# Patient Record
Sex: Female | Born: 1965 | Race: White | Hispanic: No | Marital: Married | Smoking: Never smoker
Health system: Southern US, Community
[De-identification: ages and names within clinical notes are randomized; demographics above are authoritative.]

---

## 1998-07-08 ENCOUNTER — Inpatient Hospital Stay (HOSPITAL_COMMUNITY): Admission: AD | Admit: 1998-07-08 | Discharge: 1998-07-08 | Payer: Self-pay | Admitting: Obstetrics and Gynecology

## 1998-07-14 ENCOUNTER — Inpatient Hospital Stay (HOSPITAL_COMMUNITY): Admission: AD | Admit: 1998-07-14 | Discharge: 1998-07-14 | Payer: Self-pay | Admitting: Obstetrics and Gynecology

## 1999-10-17 ENCOUNTER — Other Ambulatory Visit: Admission: RE | Admit: 1999-10-17 | Discharge: 1999-10-17 | Payer: Self-pay | Admitting: Obstetrics & Gynecology

## 2000-11-26 ENCOUNTER — Other Ambulatory Visit: Admission: RE | Admit: 2000-11-26 | Discharge: 2000-11-26 | Payer: Self-pay | Admitting: Obstetrics & Gynecology

## 2001-12-29 ENCOUNTER — Other Ambulatory Visit: Admission: RE | Admit: 2001-12-29 | Discharge: 2001-12-29 | Payer: Self-pay | Admitting: Obstetrics and Gynecology

## 2002-01-01 ENCOUNTER — Encounter: Admission: RE | Admit: 2002-01-01 | Discharge: 2002-01-01 | Payer: Self-pay | Admitting: Obstetrics & Gynecology

## 2002-01-01 ENCOUNTER — Encounter: Payer: Self-pay | Admitting: Obstetrics & Gynecology

## 2003-03-03 ENCOUNTER — Other Ambulatory Visit: Admission: RE | Admit: 2003-03-03 | Discharge: 2003-03-03 | Payer: Self-pay | Admitting: Obstetrics and Gynecology

## 2004-03-20 ENCOUNTER — Other Ambulatory Visit: Admission: RE | Admit: 2004-03-20 | Discharge: 2004-03-20 | Payer: Self-pay | Admitting: Obstetrics and Gynecology

## 2004-08-22 ENCOUNTER — Ambulatory Visit: Payer: Self-pay | Admitting: Internal Medicine

## 2005-03-08 ENCOUNTER — Ambulatory Visit (HOSPITAL_COMMUNITY): Admission: RE | Admit: 2005-03-08 | Discharge: 2005-03-08 | Payer: Self-pay | Admitting: Obstetrics & Gynecology

## 2005-03-26 ENCOUNTER — Other Ambulatory Visit: Admission: RE | Admit: 2005-03-26 | Discharge: 2005-03-26 | Payer: Self-pay | Admitting: Obstetrics & Gynecology

## 2005-06-04 ENCOUNTER — Encounter: Admission: RE | Admit: 2005-06-04 | Discharge: 2005-06-04 | Payer: Self-pay | Admitting: Obstetrics & Gynecology

## 2005-07-04 ENCOUNTER — Ambulatory Visit: Payer: Self-pay | Admitting: Internal Medicine

## 2005-08-20 ENCOUNTER — Ambulatory Visit: Payer: Self-pay | Admitting: Internal Medicine

## 2005-08-23 ENCOUNTER — Ambulatory Visit: Payer: Self-pay | Admitting: Internal Medicine

## 2005-09-19 ENCOUNTER — Other Ambulatory Visit: Admission: RE | Admit: 2005-09-19 | Discharge: 2005-09-19 | Payer: Self-pay | Admitting: Obstetrics & Gynecology

## 2006-08-19 ENCOUNTER — Ambulatory Visit: Payer: Self-pay | Admitting: Internal Medicine

## 2006-08-19 LAB — CONVERTED CEMR LAB
AST: 17 units/L (ref 0–37)
Bilirubin, Direct: 0.1 mg/dL (ref 0.0–0.3)
Chloride: 110 meq/L (ref 96–112)
Cholesterol: 181 mg/dL (ref 0–200)
Creatinine, Ser: 0.7 mg/dL (ref 0.4–1.2)
Eosinophils Absolute: 0.1 10*3/uL (ref 0.0–0.6)
Eosinophils Relative: 0.9 % (ref 0.0–5.0)
GFR calc non Af Amer: 98 mL/min
Glucose, Bld: 99 mg/dL (ref 70–99)
HCT: 34 % — ABNORMAL LOW (ref 36.0–46.0)
Hemoglobin: 11.5 g/dL — ABNORMAL LOW (ref 12.0–15.0)
Ketones, ur: NEGATIVE mg/dL
LDL Cholesterol: 106 mg/dL — ABNORMAL HIGH (ref 0–99)
Leukocytes, UA: NEGATIVE
MCV: 90.2 fL (ref 78.0–100.0)
Neutrophils Relative %: 73.7 % (ref 43.0–77.0)
Nitrite: NEGATIVE
RBC: 3.77 M/uL — ABNORMAL LOW (ref 3.87–5.11)
Sodium: 142 meq/L (ref 135–145)
TSH: 1.51 microintl units/mL (ref 0.35–5.50)
Total Bilirubin: 0.9 mg/dL (ref 0.3–1.2)
Urobilinogen, UA: 0.2 (ref 0.0–1.0)
WBC: 6.1 10*3/uL (ref 4.5–10.5)

## 2006-08-26 ENCOUNTER — Ambulatory Visit: Payer: Self-pay | Admitting: Internal Medicine

## 2007-02-28 ENCOUNTER — Encounter: Admission: RE | Admit: 2007-02-28 | Discharge: 2007-02-28 | Payer: Self-pay | Admitting: Obstetrics & Gynecology

## 2007-04-25 ENCOUNTER — Telehealth: Payer: Self-pay | Admitting: Internal Medicine

## 2007-04-29 ENCOUNTER — Encounter: Payer: Self-pay | Admitting: Internal Medicine

## 2007-05-01 IMAGING — CR DG CHEST 1V PORT
1 series · 1 of 1 positions shown · non-contrast
Comparison: None.

CLINICAL DATA: Chest tightness. 
 PORTABLE CHEST - 1 VIEW:

[AP]
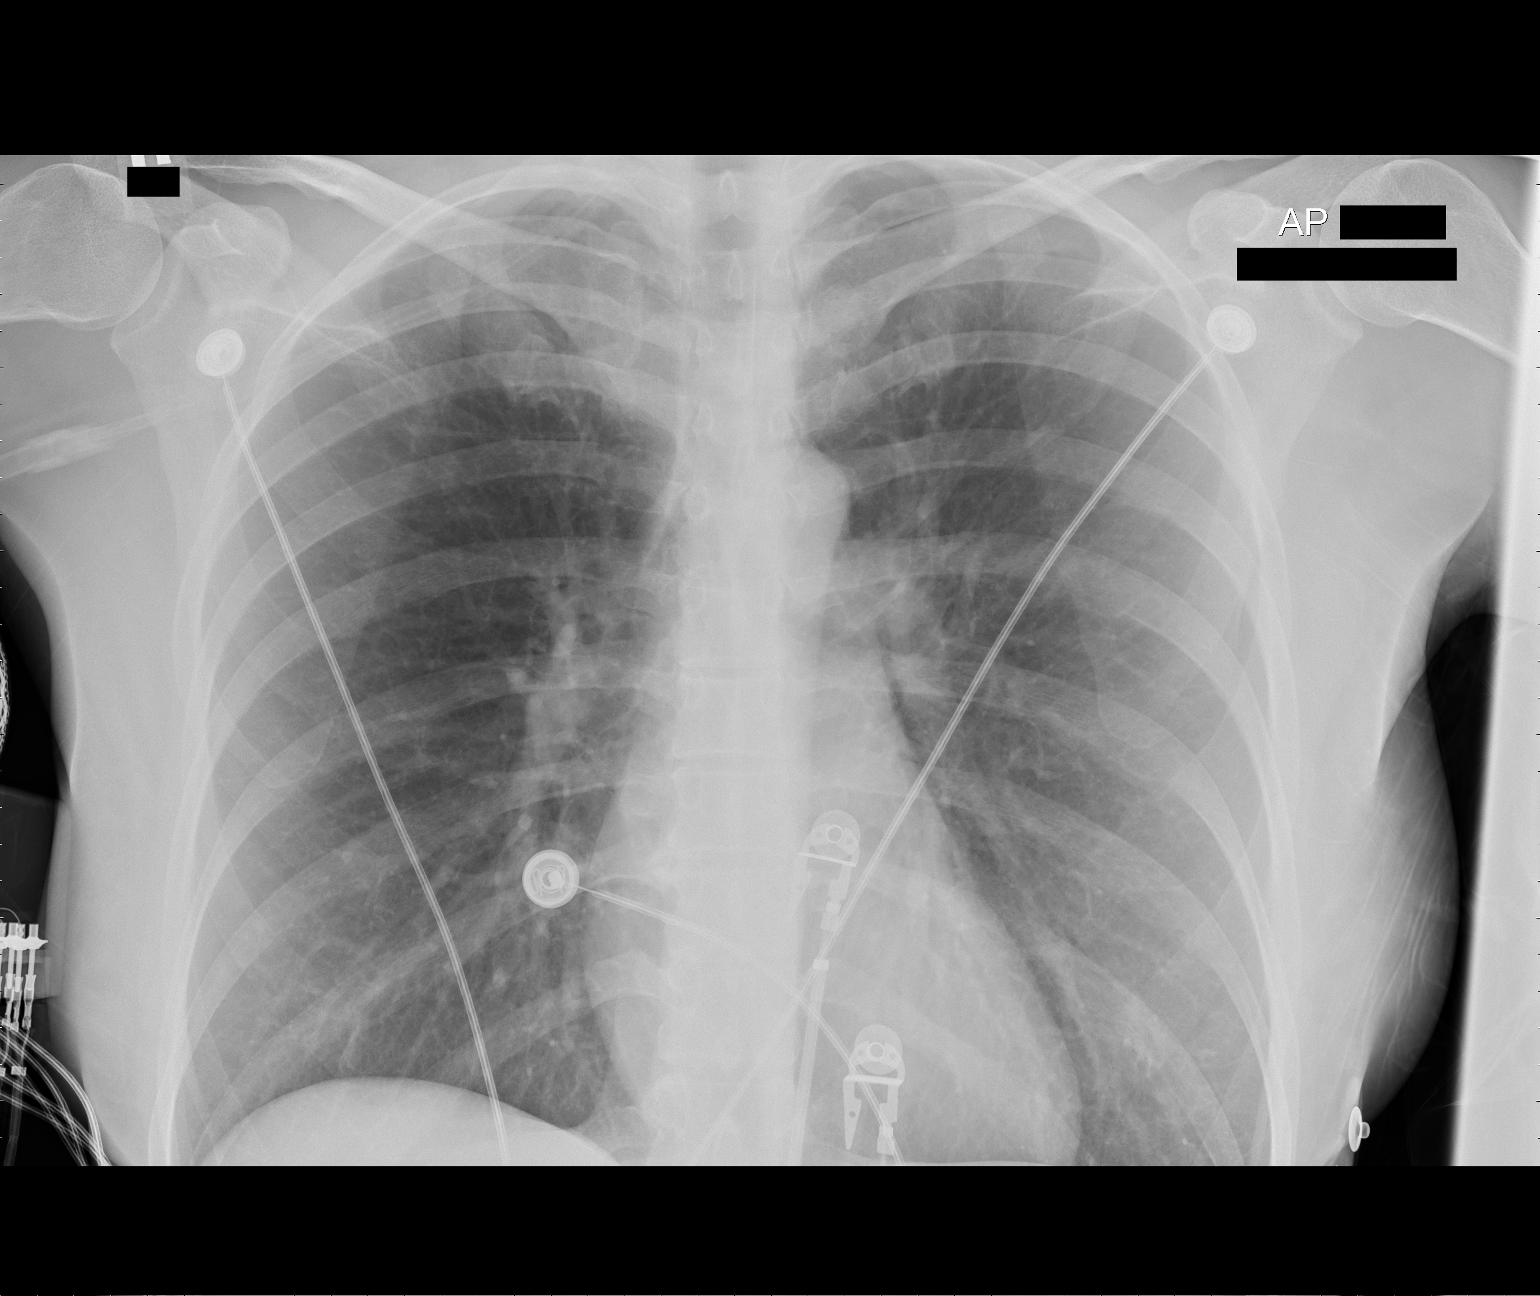

[1 of 1 positions shown; findings below may reference images not displayed]

FINDINGS: Lungs are clear.  Heart size is normal.  No pleural effusion or focal bony abnormality.
IMPRESSION: No acute disease.

## 2007-05-02 ENCOUNTER — Ambulatory Visit: Payer: Self-pay | Admitting: Internal Medicine

## 2007-05-02 ENCOUNTER — Observation Stay (HOSPITAL_COMMUNITY): Admission: EM | Admit: 2007-05-02 | Discharge: 2007-05-02 | Payer: Self-pay | Admitting: Emergency Medicine

## 2007-05-06 ENCOUNTER — Encounter: Payer: Self-pay | Admitting: Cardiology

## 2007-05-06 ENCOUNTER — Ambulatory Visit: Payer: Self-pay | Admitting: Physician Assistant

## 2007-05-08 ENCOUNTER — Telehealth: Payer: Self-pay | Admitting: Internal Medicine

## 2007-05-13 ENCOUNTER — Ambulatory Visit: Payer: Self-pay | Admitting: Internal Medicine

## 2007-05-13 DIAGNOSIS — F329 Major depressive disorder, single episode, unspecified: Secondary | ICD-10-CM

## 2007-05-13 DIAGNOSIS — G4700 Insomnia, unspecified: Secondary | ICD-10-CM

## 2007-05-13 DIAGNOSIS — R079 Chest pain, unspecified: Secondary | ICD-10-CM | POA: Insufficient documentation

## 2007-05-13 DIAGNOSIS — S139XXA Sprain of joints and ligaments of unspecified parts of neck, initial encounter: Secondary | ICD-10-CM | POA: Insufficient documentation

## 2007-05-16 DIAGNOSIS — F411 Generalized anxiety disorder: Secondary | ICD-10-CM | POA: Insufficient documentation

## 2007-09-25 ENCOUNTER — Encounter: Admission: RE | Admit: 2007-09-25 | Discharge: 2007-09-25 | Payer: Self-pay | Admitting: Obstetrics & Gynecology

## 2007-12-11 ENCOUNTER — Encounter: Payer: Self-pay | Admitting: Internal Medicine

## 2007-12-16 ENCOUNTER — Encounter: Payer: Self-pay | Admitting: Internal Medicine

## 2008-06-09 ENCOUNTER — Telehealth: Payer: Self-pay | Admitting: Internal Medicine

## 2008-12-15 ENCOUNTER — Encounter: Admission: RE | Admit: 2008-12-15 | Discharge: 2008-12-15 | Payer: Self-pay | Admitting: Obstetrics & Gynecology

## 2010-03-06 ENCOUNTER — Encounter: Admission: RE | Admit: 2010-03-06 | Discharge: 2010-03-06 | Payer: Self-pay | Admitting: Obstetrics & Gynecology

## 2010-07-09 ENCOUNTER — Encounter: Payer: Self-pay | Admitting: Obstetrics & Gynecology

## 2010-07-09 ENCOUNTER — Encounter: Payer: Self-pay | Admitting: Cardiology

## 2010-08-14 ENCOUNTER — Other Ambulatory Visit: Payer: Self-pay | Admitting: Obstetrics & Gynecology

## 2010-08-14 DIAGNOSIS — N6311 Unspecified lump in the right breast, upper outer quadrant: Secondary | ICD-10-CM

## 2010-08-28 ENCOUNTER — Other Ambulatory Visit: Payer: Self-pay | Admitting: Obstetrics & Gynecology

## 2010-08-28 ENCOUNTER — Ambulatory Visit
Admission: RE | Admit: 2010-08-28 | Discharge: 2010-08-28 | Disposition: A | Discharge status: Home / Self Care | Payer: BC Managed Care – PPO | Source: Ambulatory Visit | Attending: Obstetrics & Gynecology | Admitting: Obstetrics & Gynecology

## 2010-08-28 DIAGNOSIS — N6311 Unspecified lump in the right breast, upper outer quadrant: Secondary | ICD-10-CM

## 2010-08-31 ENCOUNTER — Other Ambulatory Visit: Payer: BC Managed Care – PPO

## 2010-09-04 ENCOUNTER — Ambulatory Visit
Admission: RE | Admit: 2010-09-04 | Discharge: 2010-09-04 | Disposition: A | Discharge status: Home / Self Care | Payer: BC Managed Care – PPO | Source: Ambulatory Visit | Attending: Obstetrics & Gynecology | Admitting: Obstetrics & Gynecology

## 2010-09-04 ENCOUNTER — Other Ambulatory Visit: Payer: Self-pay | Admitting: Diagnostic Radiology

## 2010-09-04 ENCOUNTER — Other Ambulatory Visit: Payer: Self-pay | Admitting: Obstetrics & Gynecology

## 2010-09-04 DIAGNOSIS — N6311 Unspecified lump in the right breast, upper outer quadrant: Secondary | ICD-10-CM

## 2010-10-31 NOTE — H&P (Signed)
[name], [name]            ACCOUNT NO.:  [id number]   MEDICAL RECORD NO.:  [id number]          PATIENT TYPE:  EMS   LOCATION:  MAJO                         FACILITY:  [facility]   PHYSICIAN:  Sabino Donovan, MD        DATE OF BIRTH:  22-Dec-1965   DATE OF ADMISSION:  05/01/2007  DATE OF DISCHARGE:                              HISTORY & PHYSICAL   CHIEF COMPLAINT:  Chest pain.   HISTORY OF PRESENT ILLNESS:  This is a 45 year old black female without  significant past medical history who presented complaining of chest  pain, reports she has been having this chest pain for three weeks,  mostly at night.  She describes the pain as left-sided, non-radiating,  lasting about few  minutes with associated palpitations, no exertional  component.  Patient does report family history of premature coronary  artery disease and she tells me that she fell this morning, went to a  chiropractor where everything was reportedly normal.  When she came back  home, took a hot shower, after that she started noticing chest pain and  she felt that her throat was closing up with a big knot in the throat.  She called [facility] physician who recommended patient to come to the  hospital.  She was brought in by EMS which provided aspirin and  nitroglycerin en route.  As mentioned early, she reported palpitations  along with the chest pain, otherwise no shortness of breath, denied any  symptoms of CHF such as pedal edema, orthopnea, PND.   PAST MEDICAL HISTORY:  She has a weak bladder for which she takes  Enablex 15 mg p.o. daily.   PAST SURGICAL HISTORY:  1. History of anal fissure repair in 1991.  2. She also has a history of miscarriage and history of tubal      pregnancy.   MEDICATIONS:  She is on Enablex 15 mg p.o. daily.   SOCIAL HISTORY:  She lives with her husband and two kids, denied any  history of smoking, no alcohol and no drug use.   FAMILY HISTORY:  Reports that mom questionably had mild MIs in  her 25's;  her father had first heart attack at the age of 38 although he is still  alive; brother was reportedly admitted to the hospital with angina at  the age of 73.   MEDICATIONS:  Enablex 15 mg p.o. daily.   REVIEW OF SYMPTOMS:  Unremarkable.   PHYSICAL EXAMINATION:  VITAL SIGNS:  Afebrile, pulse 77, respiratory  rate 18, blood pressure 107/66, O2 saturation 99% on room air.  GENERAL:  She is in no acute distress.  HEENT:  PERRLA.  EOMI.  NECK:  Supple without any lymphadenopathy.  CARDIOVASCULAR:  Regular rate and rhythm.  No murmurs, rubs or gallops.  LUNGS:  Clear to auscultation bilaterally.  SKIN:  No rashes.  ABDOMEN:  Soft, nontender and nondistended.  Normoactive bowel sounds.  EXTREMITIES:  No clubbing, cyanosis or edema.   EKG showed rate of 74, normal sinus rhythm, normal axis, normal P-R  interval, no ST-T wave changes.   LABORATORY DATA:  White count  1.3, H&H 12.3 and 76.2, platelets 265.  Sodium 141, potassium 3.4, creatinine 0.7, glucose 101.  CK less than  1.0, troponin less than 0.05, MB 46.   ASSESSMENT/PLAN:  A 45 year old black female without any significant  past medical history now admitted for atypical, likely noncardiac chest  pain.   1. Chest pain.  Her chest pain seems to be from anxiety.  It seems      like she had an episode similar to panic given that the pain has      been going on for the last three weeks without any exertional      component and only happening at night.  I point source a noncardiac      chest pain in origin although since she seems to have significant      family history, we will rule her out for acute myocardial      infarction.  Her first set of troponins is negative.  We will also      order a thallium to be done in the morning although it can be done      as an outpatient basis.  Will check fasting lipids, give aspirin      for now and monitor patient.      Sabino Donovan, MD     MJ/MEDQ  D:  05/02/2007  T:   05/02/2007  Job:  [id number]

## 2010-10-31 NOTE — Discharge Summary (Signed)
[name], [name]            ACCOUNT NO.:  [id number]   MEDICAL RECORD NO.:  [id number]          PATIENT TYPE:  INP   LOCATION:  3729                         FACILITY:  [facility]   PHYSICIAN:  Valerie A. Felicity Coyer, MDDATE OF BIRTH:  08-19-1965   DATE OF ADMISSION:  05/01/2007  DATE OF DISCHARGE:                               DISCHARGE SUMMARY   DISCHARGE DIAGNOSIS:  1. Atypical chest pain, likely secondary to anxiety.  2. Mild hypokalemia.  3. Bladder weakness.   HISTORY OF PRESENT ILLNESS:  [name] is a 45 year old white female  without any significant past medical history, who presented with chief  complaint of chest pain.  She noted that she had been having this chest  pain for 3 weeks which was worse at night.  She described it as left-  sided, non-radiating, and associated palpitations.  There was no  exertional component.  She notes that she had increased stress and  anxiety recently.  Point care and followup cardiac enzymes were  negative.  The patient is unwilling to stay for a third set of enzymes.  Chest x-ray showed normal sinus rhythm.  She also noted some chronic  postnasal drip which she has had for several weeks. She was offered an  inhaled nasal steroid and refused at time of discharge.  She will be  given K-Dur 20 mEq prior to discharge.  She is also noted to have a mild  normocytic anemia which will need outpatient followup.  D-dimer was  negative oxygen saturation is 100% on room air.  She did undergo a  portion of a stress test during this admission which included the  resting images.  She will need to follow up at [facility] for  the second phase.   MEDICATIONS AT TIME OF DISCHARGE:  Enablex 15 mg p.o. daily.   PERTINENT LABORATORY DATA:  At time of discharge cardiac enzymes  negative x2, BUN 10, creatinine 0.64, hemoglobin 11.6, hematocrit 33.6.   DISPOSITION:  The patient will be discharged to home.   FOLLOWUP:  The patient is scheduled to  follow up with Dr. Jacinta Shoe on November 25 at 3:30 p.m.  In addition, she is to follow up  at [facility] for the second phase of the stress test, which is  currently being set up by [facility].     Sandford Craze, NP      Raenette Rover. Felicity Coyer, MD  Electronically Signed   MO/MEDQ  D:  05/02/2007  T:  05/03/2007  Job:  [id number]   cc:   Georgina Quint. Plotnikov, MD

## 2010-11-03 NOTE — Assessment & Plan Note (Signed)
[facility]                           PRIMARY CARE OFFICE NOTE   NAME:[name], [name]                   MRN:          [id number]  DATE:08/26/2006                            DOB:          02-11-1966    The patient is a 45 year old female presents for a wellness examination.   PAST MEDICAL HISTORY:  As per August 23, 2005.   FAMILY HISTORY:  As per August 23, 2005.   SOCIAL HISTORY:  As per August 23, 2005.   ALLERGIES:  Reviewed with the patient.   MEDICINES:  Reviewed with the patient.   REVIEW OF SYSTEMS:  No chest pain, shortness of breath, has been under a  lot of stress, exercising regular, and the rest of the 18-point review  is negative.   PHYSICAL EXAMINATION:  VITAL SIGNS:  Blood pressure 95/61, pulse 67,  temp 97.6, weight 144.  GENERAL:  She looks well.  She is in no acute distress.  HEENT:  Moist mucosa.  NECK:  Supple.  No thyromegaly or bruit.  LUNGS:  Clear.  No wheezes or rales.  HEART:  S1 S2.  No murmur, rub, or gallop.  ABDOMEN:  Soft, nontender.  No organomegaly or masses felt.  LOWER EXTREMITIES:  Without edema.  NEUROLOGIC:  She is alert and cooperative.  Denies being depressed.  SKIN:  Clear.   LABORATORY:  August 19, 2006, hemoglobin 11.5.  LFTs normal.  Cholesterol  total 181, HDL 65, LDL 106.  TSH normal.   ASSESSMENT/PLAN:  1. Normal wellness examination.  Age/health related issues discussed,      healthy lifestyle discussed, regular GYN care with Dr. Jennette Kettle.  She      is status post endometrial ablation in September 2006.  2. Upper respiratory infection, likely viral.  Call with problems.  3. Insomnia.  Xanax 1 mg, one half - one p.r.n., #30, with 11 refills.   If everything is fine, I will see her back in one year.     Georgina Quint. Plotnikov, MD  Electronically Signed    AVP/MedQ  DD: 08/29/2006  DT: 08/30/2006  Job #: [id number]   cc:   Freddy Finner, M.D.

## 2010-11-03 NOTE — Op Note (Signed)
[name], [name]            ACCOUNT NO.:  [id number]   MEDICAL RECORD NO.:  [id number]          PATIENT TYPE:  AMB   LOCATION:  SDC                           FACILITY:  [facility]   PHYSICIAN:  Freddy Finner, M.D.   DATE OF BIRTH:  August 24, 1965   DATE OF PROCEDURE:  03/08/2005  DATE OF DISCHARGE:                                 OPERATIVE REPORT   PREOPERATIVE DIAGNOSES:  Menorrhagia.   POSTOPERATIVE DIAGNOSES:  Menorrhagia.   OPERATIVE PROCEDURE:  Hysteroscopy, NovaSure endometrial ablation.   SURGEON:  Dr. Jennette Kettle   ANESTHESIA:  General.   ESTIMATED INTRAOPERATIVE BLOOD LOSS:  10 mL.   Lactated Ringer's deficit 50 mL.   INTRAOPERATIVE COMPLICATIONS:  None.   Patient is a 45 year old who has regular menses, but very heavy menses.  She  is now admitted for surgery.  She has a history of mitral valve prolapse.  She was given 2 g of Rocephin IV preoperatively.  She was brought to the  operating room, there placed under adequate general anesthesia, placed in  the dorsal lithotomy position.  Betadine prep of mons, perineum, and vagina  was carried out in the usual fashion.  Sterile drapes were applied.  Bivalve  speculum was introduced.  Cervix was grasped on the anterior cervical lip  with a single tooth tenaculum.  Paracervical block was placed using 10 mL of  1% plain Xylocaine.  Cervix sounded to 3 cm, uterus to 9 cm.  Cervix was  dilated to 23 with Hegar dilators.  12.5 degree [facility] hysteroscope was  introduced and inspection of the cavity revealed no apparent abnormalities.  The NovaSure device was then applied by standard procedure.  Cavity width  was 4.1 cm.  The ablation procedure was then accomplished after adequate  testing for cavity integrity.  A total of 135 watts of power was used for 62  seconds.  Reinspection of the cavity revealed adequate ablation of the  endometrium.  All instruments were then removed.  The patient was awakened,  taken to the recovery room in good  condition.  She will be given routine  outpatient surgical instructions for follow-up in the office in two weeks.      Freddy Finner, M.D.  Electronically Signed     WRN/MEDQ  D:  03/08/2005  T:  03/08/2005  Job:  [id number]

## 2011-02-09 ENCOUNTER — Other Ambulatory Visit: Payer: Self-pay | Admitting: Obstetrics & Gynecology

## 2011-02-09 DIAGNOSIS — Z1231 Encounter for screening mammogram for malignant neoplasm of breast: Secondary | ICD-10-CM

## 2011-03-09 ENCOUNTER — Ambulatory Visit
Admission: RE | Admit: 2011-03-09 | Discharge: 2011-03-09 | Disposition: A | Discharge status: Home / Self Care | Payer: Managed Care, Other (non HMO) | Source: Ambulatory Visit | Attending: Obstetrics & Gynecology | Admitting: Obstetrics & Gynecology

## 2011-03-09 DIAGNOSIS — Z1231 Encounter for screening mammogram for malignant neoplasm of breast: Secondary | ICD-10-CM

## 2011-03-27 LAB — LIPID PANEL
Cholesterol: 146
HDL: 50

## 2011-03-27 LAB — TROPONIN I: Troponin I: 0.02

## 2011-03-27 LAB — PROTIME-INR
INR: 1
Prothrombin Time: 13.3

## 2011-03-27 LAB — I-STAT 8, (EC8 V) (CONVERTED LAB)
BUN: 12
Bicarbonate: 24.5 — ABNORMAL HIGH
Chloride: 107
Glucose, Bld: 101 — ABNORMAL HIGH
HCT: 37
Hemoglobin: 12.6
Operator id: 270651
Potassium: 3.4 — ABNORMAL LOW
Sodium: 141
TCO2: 26
pCO2, Ven: 36.8 — ABNORMAL LOW
pH, Ven: 7.432 — ABNORMAL HIGH

## 2011-03-27 LAB — CBC
HCT: 33.6 — ABNORMAL LOW
Hemoglobin: 11.6 — ABNORMAL LOW
MCHC: 34.6
MCV: 89.6
Platelets: 229
Platelets: 265
RBC: 3.74 — ABNORMAL LOW
RDW: 13
RDW: 13.1
WBC: 8.1

## 2011-03-27 LAB — COMPREHENSIVE METABOLIC PANEL
Albumin: 3.2 — ABNORMAL LOW
BUN: 10
Chloride: 107
Creatinine, Ser: 0.64
GFR calc non Af Amer: >60
Glucose, Bld: 100 — ABNORMAL HIGH
Total Bilirubin: 1

## 2011-03-27 LAB — POCT CARDIAC MARKERS
CKMB, poc: <1 — ABNORMAL LOW
CKMB, poc: <1 — ABNORMAL LOW
Myoglobin, poc: 48.6
Myoglobin, poc: 50.6
Operator id: 270651
Operator id: 294341
Troponin i, poc: <0.05
Troponin i, poc: <0.05

## 2011-03-27 LAB — DIFFERENTIAL
Basophils Absolute: 0.1
Lymphocytes Relative: 22
Neutro Abs: 6.2

## 2011-03-27 LAB — CK TOTAL AND CKMB (NOT AT ARMC)
CK, MB: 0.9
Relative Index: INVALID
Total CK: 86

## 2011-03-27 LAB — TSH: TSH: 2.299

## 2011-03-27 LAB — POCT I-STAT CREATININE
Creatinine, Ser: 0.7
Operator id: 270651

## 2011-03-27 LAB — T4, FREE: Free T4: 0.95

## 2012-05-08 ENCOUNTER — Other Ambulatory Visit: Payer: Self-pay | Admitting: Obstetrics & Gynecology

## 2012-05-08 DIAGNOSIS — Z1231 Encounter for screening mammogram for malignant neoplasm of breast: Secondary | ICD-10-CM

## 2012-05-30 ENCOUNTER — Ambulatory Visit
Admission: RE | Admit: 2012-05-30 | Discharge: 2012-05-30 | Disposition: A | Discharge status: Home / Self Care | Payer: Medicare HMO | Source: Ambulatory Visit | Attending: Obstetrics & Gynecology | Admitting: Obstetrics & Gynecology

## 2012-05-30 DIAGNOSIS — Z1231 Encounter for screening mammogram for malignant neoplasm of breast: Secondary | ICD-10-CM

## 2012-07-28 ENCOUNTER — Encounter: Payer: Self-pay | Admitting: Internal Medicine

## 2012-07-28 ENCOUNTER — Other Ambulatory Visit (INDEPENDENT_AMBULATORY_CARE_PROVIDER_SITE_OTHER): Payer: BC Managed Care – PPO

## 2012-07-28 ENCOUNTER — Ambulatory Visit (INDEPENDENT_AMBULATORY_CARE_PROVIDER_SITE_OTHER): Payer: BC Managed Care – PPO | Admitting: Internal Medicine

## 2012-07-28 VITALS — BP 110/70 | HR 80 | Temp 98.1°F | Resp 12 | Ht 65.0 in | Wt 151.0 lb

## 2012-07-28 DIAGNOSIS — Z0000 Encounter for general adult medical examination without abnormal findings: Secondary | ICD-10-CM | POA: Insufficient documentation

## 2012-07-28 DIAGNOSIS — N3010 Interstitial cystitis (chronic) without hematuria: Secondary | ICD-10-CM | POA: Insufficient documentation

## 2012-07-28 DIAGNOSIS — F329 Major depressive disorder, single episode, unspecified: Secondary | ICD-10-CM

## 2012-07-28 LAB — BASIC METABOLIC PANEL
BUN: 13 mg/dL (ref 6–23)
CO2: 29 mEq/L (ref 19–32)
Chloride: 104 mEq/L (ref 96–112)
GFR: 115.29 mL/min (ref >60.00)
Glucose, Bld: 94 mg/dL (ref 70–99)
Potassium: 3.9 mEq/L (ref 3.5–5.1)

## 2012-07-28 LAB — CBC WITH DIFFERENTIAL/PLATELET
Basophils Absolute: 0.1 10*3/uL (ref 0.0–0.1)
Eosinophils Absolute: 0.2 10*3/uL (ref 0.0–0.7)
HCT: 38.3 % (ref 36.0–46.0)
Lymphs Abs: 1.4 10*3/uL (ref 0.7–4.0)
MCHC: 34.1 g/dL (ref 30.0–36.0)
MCV: 90.1 fl (ref 78.0–100.0)
Monocytes Absolute: 0.4 10*3/uL (ref 0.1–1.0)
Neutro Abs: 3.6 10*3/uL (ref 1.4–7.7)
Platelets: 274 10*3/uL (ref 150.0–400.0)
RDW: 13.1 % (ref 11.5–14.6)

## 2012-07-28 LAB — LIPID PANEL
Cholesterol: 192 mg/dL (ref 0–200)
LDL Cholesterol: 110 mg/dL — ABNORMAL HIGH (ref 0–99)
Triglycerides: 68 mg/dL (ref 0.0–149.0)
VLDL: 13.6 mg/dL (ref 0.0–40.0)

## 2012-07-28 LAB — HEPATIC FUNCTION PANEL
Albumin: 3.9 g/dL (ref 3.5–5.2)
Total Bilirubin: 0.9 mg/dL (ref 0.3–1.2)

## 2012-07-28 LAB — URINALYSIS
Bilirubin Urine: NEGATIVE
Hgb urine dipstick: NEGATIVE
Total Protein, Urine: NEGATIVE
Urine Glucose: NEGATIVE
pH: 7 (ref 5.0–8.0)

## 2012-07-28 MED ORDER — VITAMIN D 1000 UNITS PO TABS: 1000.0000 [IU] | ORAL_TABLET | Freq: Every day | ORAL | Status: AC

## 2012-07-28 NOTE — Assessment & Plan Note (Signed)
We discussed age appropriate health related issues, including available/recomended screening tests and vaccinations. We discussed a need for adhering to healthy diet and exercise. Labs/EKG were reviewed/ordered. All questions were answered.   

## 2012-07-28 NOTE — Assessment & Plan Note (Signed)
Continue with current prescription therapy as reflected on the Med list.  

## 2012-07-28 NOTE — Progress Notes (Signed)
  Subjective:    Patient ID: [name], female    DOB: [birth year], 47 y.o.   MRN: [id number]  HPI  The patient is here for a wellness exam. The patient has been doing well overall without major physical or psychological issues going on lately. C/o stress  Review of Systems     Objective:   Physical Exam        Assessment & Plan:

## 2012-07-28 NOTE — Assessment & Plan Note (Signed)
Chronic Dr [name]

## 2013-04-23 ENCOUNTER — Other Ambulatory Visit: Payer: Self-pay

## 2013-05-13 ENCOUNTER — Other Ambulatory Visit: Payer: Self-pay

## 2013-05-13 DIAGNOSIS — Z1231 Encounter for screening mammogram for malignant neoplasm of breast: Secondary | ICD-10-CM

## 2013-06-19 ENCOUNTER — Ambulatory Visit
Admission: RE | Admit: 2013-06-19 | Discharge: 2013-06-19 | Disposition: A | Discharge status: Home / Self Care | Payer: BC Managed Care – PPO | Source: Ambulatory Visit

## 2013-06-19 DIAGNOSIS — Z1231 Encounter for screening mammogram for malignant neoplasm of breast: Secondary | ICD-10-CM

## 2013-11-03 ENCOUNTER — Encounter: Payer: Self-pay | Admitting: Family Medicine

## 2013-11-03 ENCOUNTER — Encounter: Payer: Self-pay | Admitting: *Deleted

## 2013-11-03 ENCOUNTER — Ambulatory Visit (INDEPENDENT_AMBULATORY_CARE_PROVIDER_SITE_OTHER): Payer: BC Managed Care – PPO | Admitting: Family Medicine

## 2013-11-03 VITALS — BP 102/76 | HR 74 | Temp 98.9°F | Ht 65.0 in | Wt 153.0 lb

## 2013-11-03 DIAGNOSIS — H109 Unspecified conjunctivitis: Secondary | ICD-10-CM

## 2013-11-03 DIAGNOSIS — J069 Acute upper respiratory infection, unspecified: Secondary | ICD-10-CM

## 2013-11-03 MED ORDER — SULFACETAMIDE SODIUM 10 % OP SOLN: 2.0000 [drp] | OPHTHALMIC | Status: AC

## 2013-11-03 MED ORDER — HYDROCOD POLST-CHLORPHEN POLST 10-8 MG/5ML PO LQCR: 5.0000 mL | Freq: Two times a day (BID) | ORAL | Status: DC | PRN

## 2013-11-03 NOTE — Patient Instructions (Signed)
INSTRUCTIONS FOR UPPER RESPIRATORY INFECTION:  -plenty of rest and fluids  -As we discussed, we have prescribed a new medication (eye drops and cough medication) for you at this appointment. We discussed the common and serious potential adverse effects of this medication and you can review these and more with the pharmacist when you pick up your medication.  Please follow the instructions for use carefully and notify us immediately if you have any problems taking this medication.  -nasal saline wash 2-3 times daily (use prepackaged nasal saline or bottled/distilled water if making your own)   -clean nose with nasal saline before using the nasal steroid or sinex  -can use AFRIN nasal spray for drainage and nasal congestion - but do NOT use longer then 3-4 days  -can use tylenol or ibuprofen as directed for aches and sorethroat  -in the winter time, using a humidifier at night is helpful (please follow cleaning instructions)  -if you are taking a cough medication - use only as directed, may also try a teaspoon of honey to coat the throat and throat lozenges DO NOT drive while taking the cough medication  -for sore throat, salt water gargles can help  -follow up if you have fevers, facial pain, tooth pain, difficulty breathing or are worsening or not getting better in 5-7 days

## 2013-11-03 NOTE — Progress Notes (Signed)
Pre visit review using our clinic review tool, if applicable. No additional management support is needed unless otherwise documented below in the visit note. 

## 2013-11-03 NOTE — Progress Notes (Signed)
No chief complaint on file.   HPI:  -PCP, Dr. Huntley DecPlatnikov, unavailable -started: 5 days ago -symptoms:nasal congestion, sore throat, cough, PND, laryngitis, pink eye bilat but pus from R eye -denies:fever, SOB, NVD, tooth pain -has tried: OTC meds and flonase -sick contacts/travel/risks: denies flu exposure, tick exposure or or Ebola risks -Hx of: allergies -son with same symptoms recently  ROS: See pertinent positives and negatives per HPI.  No past medical history on file.  No past surgical history on file.  Family History  Problem Relation Age of Onset  . Hypertension Father   . Heart disease Father [phone]    CHF  . Cancer Father     colon  . Hypothyroidism Sister   . Cancer Brother [phone]    carcinoid    History   Social History  . Marital Status: Married    Spouse Name: N/A    Number of Children: N/A  . Years of Education: N/A   Social History Main Topics  . Smoking status: Never Smoker   . Smokeless tobacco: None  . Alcohol Use: No  . Drug Use: No  . Sexual Activity: Yes   Other Topics Concern  . None   Social History Narrative  . None    Current outpatient prescriptions:fluconazole (DIFLUCAN) 200 MG tablet, Take 200 mg by mouth as needed. , Disp: , Rfl: ;  FLUoxetine (PROZAC) 20 MG tablet, Take 20 mg by mouth daily., Disp: , Rfl: ;  LORazepam (ATIVAN) 1 MG tablet, Take 0.5 mg by mouth at bedtime. , Disp: , Rfl: ;  Phenylephrine-Ibuprofen (ADVIL CONGESTION RELIEF PO), Take by mouth., Disp: , Rfl:  chlorpheniramine-HYDROcodone (TUSSIONEX PENNKINETIC ER) 10-8 MG/5ML LQCR, Take 5 mLs by mouth every 12 (twelve) hours as needed., Disp: 115 mL, Rfl: 0;  sulfacetamide (BLEPH-10) 10 % ophthalmic solution, Place 2 drops into the right eye every 3 (three) hours while awake., Disp: 15 mL, Rfl: 0  EXAM:  Filed Vitals:   11/03/13 1107  BP: 102/76  Pulse: 74  Temp: 98.9 F (37.2 C)    Body mass index is 25.46 kg/(m^2).  GENERAL: vitals reviewed and listed above,  alert, oriented, appears well hydrated and in no acute distress  HEENT: atraumatic, conjunttiva clear, no obvious abnormalities on inspection of external nose and ears, normal appearance of ear canals and TMs, clear nasal congestion, mild post oropharyngeal erythema with PND, no tonsillar edema or exudate, no sinus TTP  NECK: no obvious masses on inspection  LUNGS: clear to auscultation bilaterally, no wheezes, rales or rhonchi, good air movement  CV: HRRR, no peripheral edema  MS: moves all extremities without noticeable abnormality  PSYCH: pleasant and cooperative, no obvious depression or anxiety  ASSESSMENT AND PLAN:  Discussed the following assessment and plan:  Conjunctivitis - Plan: sulfacetamide (BLEPH-10) 10 % ophthalmic solution  Upper respiratory infection - Plan: chlorpheniramine-HYDROcodone (TUSSIONEX PENNKINETIC ER) 10-8 MG/5ML LQCR  -given HPI and exam findings today, a serious infection or illness is unlikely. We discussed potential etiologies, with VURI being most likely, and advised supportive care and monitoring. We discussed treatment side effects, likely course, antibiotic misuse, transmission, and signs of developing a serious illness. -tx eye symptoms with abx incase secondary bacterial infection -of course, we advised to return or notify a doctor immediately if symptoms worsen or persist or new concerns arise.    Patient Instructions  INSTRUCTIONS FOR UPPER RESPIRATORY INFECTION:  -plenty of rest and fluids  -As we discussed, we have prescribed a new medication (eye drops  and cough medication) for you at this appointment. We discussed the common and serious potential adverse effects of this medication and you can review these and more with the pharmacist when you pick up your medication.  Please follow the instructions for use carefully and notify us immediately if you have any problems taking this medication.  -nasal saline wash 2-3 times daily (use  prepackaged nasal saline or bottled/distilled water if making your own)   -clean nose with nasal saline before using the nasal steroid or sinex  -can use AFRIN nasal spray for drainage and nasal congestion - but do NOT use longer then 3-4 days  -can use tylenol or ibuprofen as directed for aches and sorethroat  -in the winter time, using a humidifier at night is helpful (please follow cleaning instructions)  -if you are taking a cough medication - use only as directed, may also try a teaspoon of honey to coat the throat and throat lozenges DO NOT drive while taking the cough medication  -for sore throat, salt water gargles can help  -follow up if you have fevers, facial pain, tooth pain, difficulty breathing or are worsening or not getting better in 5-7 days      Lynn KoyanagiHannah R. Barabara Foster

## 2014-04-07 ENCOUNTER — Ambulatory Visit: Payer: BC Managed Care – PPO | Admitting: Family

## 2014-04-12 ENCOUNTER — Other Ambulatory Visit: Payer: Self-pay

## 2014-04-12 DIAGNOSIS — Z1231 Encounter for screening mammogram for malignant neoplasm of breast: Secondary | ICD-10-CM

## 2014-05-24 ENCOUNTER — Other Ambulatory Visit: Payer: Self-pay | Admitting: Obstetrics & Gynecology

## 2014-05-25 LAB — CYTOLOGY - PAP

## 2014-06-21 ENCOUNTER — Ambulatory Visit
Admission: RE | Admit: 2014-06-21 | Discharge: 2014-06-21 | Disposition: A | Discharge status: Home / Self Care | Payer: BC Managed Care – PPO | Source: Ambulatory Visit

## 2014-06-21 DIAGNOSIS — Z1231 Encounter for screening mammogram for malignant neoplasm of breast: Secondary | ICD-10-CM

## 2014-11-25 ENCOUNTER — Encounter: Payer: Self-pay | Admitting: Podiatry

## 2014-11-25 ENCOUNTER — Ambulatory Visit (INDEPENDENT_AMBULATORY_CARE_PROVIDER_SITE_OTHER): Payer: BC Managed Care – PPO | Admitting: Podiatry

## 2014-11-25 ENCOUNTER — Ambulatory Visit: Payer: Self-pay

## 2014-11-25 VITALS — BP 105/59 | HR 74 | Resp 15

## 2014-11-25 DIAGNOSIS — M79675 Pain in left toe(s): Secondary | ICD-10-CM

## 2014-11-25 DIAGNOSIS — M722 Plantar fascial fibromatosis: Secondary | ICD-10-CM

## 2014-11-25 MED ORDER — DICLOFENAC SODIUM 75 MG PO TBEC: 75.0000 mg | DELAYED_RELEASE_TABLET | Freq: Two times a day (BID) | ORAL | Status: DC

## 2014-11-25 MED ORDER — TRIAMCINOLONE ACETONIDE 10 MG/ML IJ SUSP
10.0000 mg | Freq: Once | INTRAMUSCULAR | Status: AC
  Administered 2014-11-25: 10 mg

## 2014-11-25 NOTE — Progress Notes (Signed)
   Subjective:    Patient ID: [name], female    DOB: 04-Aug-1965, 49 y.o.   MRN: [id number]  HPI Pt presents with left great toe pain and numbness with swelling for 1 day, pain was more severe this morning, pain radiates towrd arch, she denies injury to area   Review of Systems  All other systems reviewed and are negative.      Objective:   Physical Exam        Assessment & Plan:

## 2014-11-26 NOTE — Progress Notes (Signed)
Subjective:     Patient ID: [name], female   DOB: 1965/10/02, 49 y.o.   MRN: [id number]  HPI patient presents stating I'm having a lot of pain in the plantar aspect of my left arch and I'm not sure what I did but I'm having trouble ambulating on it. States does not remember specific injury   Review of Systems  All other systems reviewed and are negative.      Objective:   Physical Exam  Constitutional: She is oriented to person, place, and time.  Cardiovascular: Intact distal pulses.   Musculoskeletal: Normal range of motion.  Neurological: She is oriented to person, place, and time.  Skin: Skin is warm.  Nursing note and vitals reviewed.  neurovascular status intact muscle strength adequate with range of motion subtalar midtarsal joint within normal limits. Patient's plantar arch on the distal portion before the insertion to the metatarsal is quite inflamed and sore when pressed and there is no pain underneath the sesamoidal complex or when I dorsiflexed or plantarflexed the toe. Good digital perfusion and is well oriented 3     Assessment:     Distal plantar fasciitis with inflammation noted left with possibility of acute injury    Plan:     H&P and condition reviewed. I did an injection of the distal fascia 3 Milligan Kenalog 5 mill grams Xylocaine and instructed on heat and ice therapy and placed on anti-inflammatory. Reappoint if symptoms persist

## 2014-11-30 ENCOUNTER — Ambulatory Visit: Payer: BC Managed Care – PPO | Admitting: Podiatry

## 2015-05-23 ENCOUNTER — Other Ambulatory Visit: Payer: Self-pay

## 2015-05-23 DIAGNOSIS — Z1231 Encounter for screening mammogram for malignant neoplasm of breast: Secondary | ICD-10-CM

## 2015-06-24 ENCOUNTER — Ambulatory Visit
Admission: RE | Admit: 2015-06-24 | Discharge: 2015-06-24 | Disposition: A | Discharge status: Home / Self Care | Payer: BC Managed Care – PPO | Source: Ambulatory Visit

## 2015-06-24 DIAGNOSIS — Z1231 Encounter for screening mammogram for malignant neoplasm of breast: Secondary | ICD-10-CM

## 2015-06-27 ENCOUNTER — Other Ambulatory Visit: Payer: Self-pay | Admitting: Obstetrics & Gynecology

## 2015-06-27 DIAGNOSIS — R928 Other abnormal and inconclusive findings on diagnostic imaging of breast: Secondary | ICD-10-CM

## 2015-06-28 ENCOUNTER — Ambulatory Visit
Admission: RE | Admit: 2015-06-28 | Discharge: 2015-06-28 | Disposition: A | Discharge status: Home / Self Care | Payer: BC Managed Care – PPO | Source: Ambulatory Visit | Attending: Obstetrics & Gynecology | Admitting: Obstetrics & Gynecology

## 2015-06-28 DIAGNOSIS — R928 Other abnormal and inconclusive findings on diagnostic imaging of breast: Secondary | ICD-10-CM

## 2015-12-26 ENCOUNTER — Encounter: Payer: Self-pay | Admitting: Podiatry

## 2015-12-26 ENCOUNTER — Ambulatory Visit (INDEPENDENT_AMBULATORY_CARE_PROVIDER_SITE_OTHER): Payer: BC Managed Care – PPO

## 2015-12-26 ENCOUNTER — Ambulatory Visit (INDEPENDENT_AMBULATORY_CARE_PROVIDER_SITE_OTHER): Payer: BC Managed Care – PPO | Admitting: Podiatry

## 2015-12-26 DIAGNOSIS — M79672 Pain in left foot: Secondary | ICD-10-CM

## 2015-12-26 DIAGNOSIS — M79671 Pain in right foot: Secondary | ICD-10-CM

## 2015-12-26 DIAGNOSIS — M722 Plantar fascial fibromatosis: Secondary | ICD-10-CM | POA: Diagnosis not present

## 2015-12-26 MED ORDER — TRIAMCINOLONE ACETONIDE 10 MG/ML IJ SUSP
10.0000 mg | Freq: Once | INTRAMUSCULAR | Status: AC
  Administered 2015-12-26: 10 mg

## 2015-12-26 NOTE — Patient Instructions (Addendum)
Achilles Tendinitis  with Rehab Achilles tendinitis is a disorder of the Achilles tendon. The Achilles tendon connects the large calf muscles (Gastrocnemius and Soleus) to the heel bone (calcaneus). This tendon is sometimes called the heel cord. It is important for pushing-off and standing on your toes and is important for walking, running, or jumping. Tendinitis is often caused by overuse and repetitive microtrauma. SYMPTOMS  Pain, tenderness, swelling, warmth, and redness may occur over the Achilles tendon even at rest.  Pain with pushing off, or flexing or extending the ankle.  Pain that is worsened after or during activity. CAUSES   Overuse sometimes seen with rapid increase in exercise programs or in sports requiring running and jumping.  Poor physical conditioning (strength and flexibility or endurance).  Running sports, especially training running down hills.  Inadequate warm-up before practice or play or failure to stretch before participation.  Injury to the tendon. PREVENTION   Warm up and stretch before practice or competition.  Allow time for adequate rest and recovery between practices and competition.  Keep up conditioning.  Keep up ankle and leg flexibility.  Improve or keep muscle strength and endurance.  Improve cardiovascular fitness.  Use proper technique.  Use proper equipment (shoes, skates).  To help prevent recurrence, taping, protective strapping, or an adhesive bandage may be recommended for several weeks after healing is complete. PROGNOSIS   Recovery may take weeks to several months to heal.  Longer recovery is expected if symptoms have been prolonged.  Recovery is usually quicker if the inflammation is due to a direct blow as compared with overuse or sudden strain. RELATED COMPLICATIONS   Healing time will be prolonged if the condition is not correctly treated. The injury must be given plenty of time to heal.  Symptoms can reoccur if  activity is resumed too soon.  Untreated, tendinitis may increase the risk of tendon rupture requiring additional time for recovery and possibly surgery. TREATMENT   The first treatment consists of rest anti-inflammatory medication, and ice to relieve the pain.  Stretching and strengthening exercises after resolution of pain will likely help reduce the risk of recurrence. Referral to a physical therapist or athletic trainer for further evaluation and treatment may be helpful.  A walking boot or cast may be recommended to rest the Achilles tendon. This can help break the cycle of inflammation and microtrauma.  Arch supports (orthotics) may be prescribed or recommended by your caregiver as an adjunct to therapy and rest.  Surgery to remove the inflamed tendon lining or degenerated tendon tissue is rarely necessary and has shown less than predictable results. MEDICATION   Nonsteroidal anti-inflammatory medications, such as aspirin and ibuprofen, may be used for pain and inflammation relief. Do not take within 7 days before surgery. Take these as directed by your caregiver. Contact your caregiver immediately if any bleeding, stomach upset, or signs of allergic reaction occur. Other minor pain relievers, such as acetaminophen, may also be used.  Pain relievers may be prescribed as necessary by your caregiver. Do not take prescription pain medication for longer than 4 to 7 days. Use only as directed and only as much as you need.  Cortisone injections are rarely indicated. Cortisone injections may weaken tendons and predispose to rupture. It is better to give the condition more time to heal than to use them. HEAT AND COLD  Cold is used to relieve pain and reduce inflammation for acute and chronic Achilles tendinitis. Cold should be applied for 10 to 15 minutes   every 2 to 3 hours for inflammation and pain and immediately after any activity that aggravates your symptoms. Use ice packs or an ice  massage.  Heat may be used before performing stretching and strengthening activities prescribed by your caregiver. Use a heat pack or a warm soak. SEEK MEDICAL CARE IF:  Symptoms get worse or do not improve in 2 weeks despite treatment.  New, unexplained symptoms develop. Drugs used in treatment may produce side effects.  EXERCISES:  RANGE OF MOTION (ROM) AND STRETCHING EXERCISES - Achilles Tendinitis  These exercises may help you when beginning to rehabilitate your injury. Your symptoms may resolve with or without further involvement from your physician, physical therapist or athletic trainer. While completing these exercises, remember:   Restoring tissue flexibility helps normal motion to return to the joints. This allows healthier, less painful movement and activity.  An effective stretch should be held for at least 30 seconds.  A stretch should never be painful. You should only feel a gentle lengthening or release in the stretched tissue.  STRETCH  Gastroc, Standing   Place hands on wall.  Extend right / left leg, keeping the front knee somewhat bent.  Slightly point your toes inward on your back foot.  Keeping your right / left heel on the floor and your knee straight, shift your weight toward the wall, not allowing your back to arch.  You should feel a gentle stretch in the right / left calf. Hold this position for 10 seconds. Repeat 3 times. Complete this stretch 2 times per day.  STRETCH  Soleus, Standing   Place hands on wall.  Extend right / left leg, keeping the other knee somewhat bent.  Slightly point your toes inward on your back foot.  Keep your right / left heel on the floor, bend your back knee, and slightly shift your weight over the back leg so that you feel a gentle stretch deep in your back calf.  Hold this position for 10 seconds. Repeat 3 times. Complete this stretch 2 times per day.  STRETCH  Gastrocsoleus, Standing  Note: This exercise can place  a lot of stress on your foot and ankle. Please complete this exercise only if specifically instructed by your caregiver.   Place the ball of your right / left foot on a step, keeping your other foot firmly on the same step.  Hold on to the wall or a rail for balance.  Slowly lift your other foot, allowing your body weight to press your heel down over the edge of the step.  You should feel a stretch in your right / left calf.  Hold this position for 10 seconds.  Repeat this exercise with a slight bend in your knee. Repeat 3 times. Complete this stretch 2 times per day.   STRENGTHENING EXERCISES - Achilles Tendinitis These exercises may help you when beginning to rehabilitate your injury. They may resolve your symptoms with or without further involvement from your physician, physical therapist or athletic trainer. While completing these exercises, remember:   Muscles can gain both the endurance and the strength needed for everyday activities through controlled exercises.  Complete these exercises as instructed by your physician, physical therapist or athletic trainer. Progress the resistance and repetitions only as guided.  You may experience muscle soreness or fatigue, but the pain or discomfort you are trying to eliminate should never worsen during these exercises. If this pain does worsen, stop and make certain you are following the directions exactly. If   the pain is still present after adjustments, discontinue the exercise until you can discuss the trouble with your clinician.  STRENGTH - Plantar-flexors   Sit with your right / left leg extended. Holding onto both ends of a rubber exercise band/tubing, loop it around the ball of your foot. Keep a slight tension in the band.  Slowly push your toes away from you, pointing them downward.  Hold this position for 10 seconds. Return slowly, controlling the tension in the band/tubing. Repeat 3 times. Complete this exercise 2 times per day.     STRENGTH - Plantar-flexors   Stand with your feet shoulder width apart. Steady yourself with a wall or table using as little support as needed.  Keeping your weight evenly spread over the width of your feet, rise up on your toes.*  Hold this position for 10 seconds. Repeat 3 times. Complete this exercise 2 times per day.  *If this is too easy, shift your weight toward your right / left leg until you feel challenged. Ultimately, you may be asked to do this exercise with your right / left foot only.  STRENGTH  Plantar-flexors, Eccentric  Note: This exercise can place a lot of stress on your foot and ankle. Please complete this exercise only if specifically instructed by your caregiver.   Place the balls of your feet on a step. With your hands, use only enough support from a wall or rail to keep your balance.  Keep your knees straight and rise up on your toes.  Slowly shift your weight entirely to your right / left toes and pick up your opposite foot. Gently and with controlled movement, lower your weight through your right / left foot so that your heel drops below the level of the step. You will feel a slight stretch in the back of your calf at the end position.  Use the healthy leg to help rise up onto the balls of both feet, then lower weight only on the right / left leg again. Build up to 15 repetitions. Then progress to 3 consecutive sets of 15 repetitions.*  After completing the above exercise, complete the same exercise with a slight knee bend (about 30 degrees). Again, build up to 15 repetitions. Then progress to 3 consecutive sets of 15 repetitions.* Perform this exercise 2 times per day.  *When you easily complete 3 sets of 15, your physician, physical therapist or athletic trainer may advise you to add resistance by wearing a backpack filled with additional weight.  STRENGTH - Plantar Flexors, Seated   Sit on a chair that allows your feet to rest flat on the ground. If  necessary, sit at the edge of the chair.  Keeping your toes firmly on the ground, lift your right / left heel as far as you can without increasing any discomfort in your ankle. Repeat 3 times. Complete this exercise 2 times a day.     Plantar Fasciitis (Heel Spur Syndrome) with Rehab The plantar fascia is a fibrous, ligament-like, soft-tissue structure that spans the bottom of the foot. Plantar fasciitis is a condition that causes pain in the foot due to inflammation of the tissue. SYMPTOMS   Pain and tenderness on the underneath side of the foot.  Pain that worsens with standing or walking. CAUSES  Plantar fasciitis is caused by irritation and injury to the plantar fascia on the underneath side of the foot. Common mechanisms of injury include:  Direct trauma to bottom of the foot.  Damage to a   small nerve that runs under the foot where the main fascia attaches to the heel bone.  Stress placed on the plantar fascia due to bone spurs. RISK INCREASES WITH:   Activities that place stress on the plantar fascia (running, jumping, pivoting, or cutting).  Poor strength and flexibility.  Improperly fitted shoes.  Tight calf muscles.  Flat feet.  Failure to warm-up properly before activity.  Obesity. PREVENTION  Warm up and stretch properly before activity.  Allow for adequate recovery between workouts.  Maintain physical fitness:  Strength, flexibility, and endurance.  Cardiovascular fitness.  Maintain a health body weight.  Avoid stress on the plantar fascia.  Wear properly fitted shoes, including arch supports for individuals who have flat feet.  PROGNOSIS  If treated properly, then the symptoms of plantar fasciitis usually resolve without surgery. However, occasionally surgery is necessary.  RELATED COMPLICATIONS   Recurrent symptoms that may result in a chronic condition.  Problems of the lower back that are caused by compensating for the injury, such as  limping.  Pain or weakness of the foot during push-off following surgery.  Chronic inflammation, scarring, and partial or complete fascia tear, occurring more often from repeated injections.  TREATMENT  Treatment initially involves the use of ice and medication to help reduce pain and inflammation. The use of strengthening and stretching exercises may help reduce pain with activity, especially stretches of the Achilles tendon. These exercises may be performed at home or with a therapist. Your caregiver may recommend that you use heel cups of arch supports to help reduce stress on the plantar fascia. Occasionally, corticosteroid injections are given to reduce inflammation. If symptoms persist for greater than 6 months despite non-surgical (conservative), then surgery may be recommended.   MEDICATION   If pain medication is necessary, then nonsteroidal anti-inflammatory medications, such as aspirin and ibuprofen, or other minor pain relievers, such as acetaminophen, are often recommended.  Do not take pain medication within 7 days before surgery.  Prescription pain relievers may be given if deemed necessary by your caregiver. Use only as directed and only as much as you need.  Corticosteroid injections may be given by your caregiver. These injections should be reserved for the most serious cases, because they may only be given a certain number of times.  HEAT AND COLD  Cold treatment (icing) relieves pain and reduces inflammation. Cold treatment should be applied for 10 to 15 minutes every 2 to 3 hours for inflammation and pain and immediately after any activity that aggravates your symptoms. Use ice packs or massage the area with a piece of ice (ice massage).  Heat treatment may be used prior to performing the stretching and strengthening activities prescribed by your caregiver, physical therapist, or athletic trainer. Use a heat pack or soak the injury in warm water.  SEEK IMMEDIATE MEDICAL  CARE IF:  Treatment seems to offer no benefit, or the condition worsens.  Any medications produce adverse side effects.  EXERCISES- RANGE OF MOTION (ROM) AND STRETCHING EXERCISES - Plantar Fasciitis (Heel Spur Syndrome) These exercises may help you when beginning to rehabilitate your injury. Your symptoms may resolve with or without further involvement from your physician, physical therapist or athletic trainer. While completing these exercises, remember:   Restoring tissue flexibility helps normal motion to return to the joints. This allows healthier, less painful movement and activity.  An effective stretch should be held for at least 30 seconds.  A stretch should never be painful. You should only feel a gentle   lengthening or release in the stretched tissue.  RANGE OF MOTION - Toe Extension, Flexion  Sit with your right / left leg crossed over your opposite knee.  Grasp your toes and gently pull them back toward the top of your foot. You should feel a stretch on the bottom of your toes and/or foot.  Hold this stretch for 10 seconds.  Now, gently pull your toes toward the bottom of your foot. You should feel a stretch on the top of your toes and or foot.  Hold this stretch for 10 seconds. Repeat  times. Complete this stretch 3 times per day.   RANGE OF MOTION - Ankle Dorsiflexion, Active Assisted  Remove shoes and sit on a chair that is preferably not on a carpeted surface.  Place right / left foot under knee. Extend your opposite leg for support.  Keeping your heel down, slide your right / left foot back toward the chair until you feel a stretch at your ankle or calf. If you do not feel a stretch, slide your bottom forward to the edge of the chair, while still keeping your heel down.  Hold this stretch for 10 seconds. Repeat 3 times. Complete this stretch 2 times per day.   STRETCH  Gastroc, Standing  Place hands on wall.  Extend right / left leg, keeping the front knee  somewhat bent.  Slightly point your toes inward on your back foot.  Keeping your right / left heel on the floor and your knee straight, shift your weight toward the wall, not allowing your back to arch.  You should feel a gentle stretch in the right / left calf. Hold this position for 10 seconds. Repeat 3 times. Complete this stretch 2 times per day.  STRETCH  Soleus, Standing  Place hands on wall.  Extend right / left leg, keeping the other knee somewhat bent.  Slightly point your toes inward on your back foot.  Keep your right / left heel on the floor, bend your back knee, and slightly shift your weight over the back leg so that you feel a gentle stretch deep in your back calf.  Hold this position for 10 seconds. Repeat 3 times. Complete this stretch 2 times per day.  STRETCH  Gastrocsoleus, Standing  Note: This exercise can place a lot of stress on your foot and ankle. Please complete this exercise only if specifically instructed by your caregiver.   Place the ball of your right / left foot on a step, keeping your other foot firmly on the same step.  Hold on to the wall or a rail for balance.  Slowly lift your other foot, allowing your body weight to press your heel down over the edge of the step.  You should feel a stretch in your right / left calf.  Hold this position for 10 seconds.  Repeat this exercise with a slight bend in your right / left knee. Repeat 3 times. Complete this stretch 2 times per day.   STRENGTHENING EXERCISES - Plantar Fasciitis (Heel Spur Syndrome)  These exercises may help you when beginning to rehabilitate your injury. They may resolve your symptoms with or without further involvement from your physician, physical therapist or athletic trainer. While completing these exercises, remember:   Muscles can gain both the endurance and the strength needed for everyday activities through controlled exercises.  Complete these exercises as instructed by  your physician, physical therapist or athletic trainer. Progress the resistance and repetitions only as guided.    STRENGTH - Towel Curls  Sit in a chair positioned on a non-carpeted surface.  Place your foot on a towel, keeping your heel on the floor.  Pull the towel toward your heel by only curling your toes. Keep your heel on the floor. Repeat 3 times. Complete this exercise 2 times per day.  STRENGTH - Ankle Inversion  Secure one end of a rubber exercise band/tubing to a fixed object (table, pole). Loop the other end around your foot just before your toes.  Place your fists between your knees. This will focus your strengthening at your ankle.  Slowly, pull your big toe up and in, making sure the band/tubing is positioned to resist the entire motion.  Hold this position for 10 seconds.  Have your muscles resist the band/tubing as it slowly pulls your foot back to the starting position. Repeat 3 times. Complete this exercises 2 times per day.  Document Released: 06/04/2005 Document Revised: 08/27/2011 Document Reviewed: 09/16/2008 [employer] Patient Information 2014 [employer].Achilles Tendinitis  with Rehab Achilles tendinitis is a disorder of the Achilles tendon. The Achilles tendon connects the large calf muscles (Gastrocnemius and Soleus) to the heel bone (calcaneus). This tendon is sometimes called the heel cord. It is important for pushing-off and standing on your toes and is important for walking, running, or jumping. Tendinitis is often caused by overuse and repetitive microtrauma. SYMPTOMS  Pain, tenderness, swelling, warmth, and redness may occur over the Achilles tendon even at rest.  Pain with pushing off, or flexing or extending the ankle.  Pain that is worsened after or during activity. CAUSES   Overuse sometimes seen with rapid increase in exercise programs or in sports requiring running and jumping.  Poor physical conditioning (strength and flexibility or  endurance).  Running sports, especially training running down hills.  Inadequate warm-up before practice or play or failure to stretch before participation.  Injury to the tendon. PREVENTION   Warm up and stretch before practice or competition.  Allow time for adequate rest and recovery between practices and competition.  Keep up conditioning.  Keep up ankle and leg flexibility.  Improve or keep muscle strength and endurance.  Improve cardiovascular fitness.  Use proper technique.  Use proper equipment (shoes, skates).  To help prevent recurrence, taping, protective strapping, or an adhesive bandage may be recommended for several weeks after healing is complete. PROGNOSIS   Recovery may take weeks to several months to heal.  Longer recovery is expected if symptoms have been prolonged.  Recovery is usually quicker if the inflammation is due to a direct blow as compared with overuse or sudden strain. RELATED COMPLICATIONS   Healing time will be prolonged if the condition is not correctly treated. The injury must be given plenty of time to heal.  Symptoms can reoccur if activity is resumed too soon.  Untreated, tendinitis may increase the risk of tendon rupture requiring additional time for recovery and possibly surgery. TREATMENT   The first treatment consists of rest anti-inflammatory medication, and ice to relieve the pain.  Stretching and strengthening exercises after resolution of pain will likely help reduce the risk of recurrence. Referral to a physical therapist or athletic trainer for further evaluation and treatment may be helpful.  A walking boot or cast may be recommended to rest the Achilles tendon. This can help break the cycle of inflammation and microtrauma.  Arch supports (orthotics) may be prescribed or recommended by your caregiver as an adjunct to therapy and rest.  Surgery to remove   the inflamed tendon lining or degenerated tendon tissue is rarely  necessary and has shown less than predictable results. MEDICATION   Nonsteroidal anti-inflammatory medications, such as aspirin and ibuprofen, may be used for pain and inflammation relief. Do not take within 7 days before surgery. Take these as directed by your caregiver. Contact your caregiver immediately if any bleeding, stomach upset, or signs of allergic reaction occur. Other minor pain relievers, such as acetaminophen, may also be used.  Pain relievers may be prescribed as necessary by your caregiver. Do not take prescription pain medication for longer than 4 to 7 days. Use only as directed and only as much as you need.  Cortisone injections are rarely indicated. Cortisone injections may weaken tendons and predispose to rupture. It is better to give the condition more time to heal than to use them. HEAT AND COLD  Cold is used to relieve pain and reduce inflammation for acute and chronic Achilles tendinitis. Cold should be applied for 10 to 15 minutes every 2 to 3 hours for inflammation and pain and immediately after any activity that aggravates your symptoms. Use ice packs or an ice massage.  Heat may be used before performing stretching and strengthening activities prescribed by your caregiver. Use a heat pack or a warm soak. SEEK MEDICAL CARE IF:  Symptoms get worse or do not improve in 2 weeks despite treatment.  New, unexplained symptoms develop. Drugs used in treatment may produce side effects.  EXERCISES:  RANGE OF MOTION (ROM) AND STRETCHING EXERCISES - Achilles Tendinitis  These exercises may help you when beginning to rehabilitate your injury. Your symptoms may resolve with or without further involvement from your physician, physical therapist or athletic trainer. While completing these exercises, remember:   Restoring tissue flexibility helps normal motion to return to the joints. This allows healthier, less painful movement and activity.  An effective stretch should be held  for at least 30 seconds.  A stretch should never be painful. You should only feel a gentle lengthening or release in the stretched tissue.  STRETCH  Gastroc, Standing   Place hands on wall.  Extend right / left leg, keeping the front knee somewhat bent.  Slightly point your toes inward on your back foot.  Keeping your right / left heel on the floor and your knee straight, shift your weight toward the wall, not allowing your back to arch.  You should feel a gentle stretch in the right / left calf. Hold this position for 10 seconds. Repeat 3 times. Complete this stretch 2 times per day.  STRETCH  Soleus, Standing   Place hands on wall.  Extend right / left leg, keeping the other knee somewhat bent.  Slightly point your toes inward on your back foot.  Keep your right / left heel on the floor, bend your back knee, and slightly shift your weight over the back leg so that you feel a gentle stretch deep in your back calf.  Hold this position for 10 seconds. Repeat 3 times. Complete this stretch 2 times per day.  STRETCH  Gastrocsoleus, Standing  Note: This exercise can place a lot of stress on your foot and ankle. Please complete this exercise only if specifically instructed by your caregiver.   Place the ball of your right / left foot on a step, keeping your other foot firmly on the same step.  Hold on to the wall or a rail for balance.  Slowly lift your other foot, allowing your body weight to   press your heel down over the edge of the step.  You should feel a stretch in your right / left calf.  Hold this position for 10 seconds.  Repeat this exercise with a slight bend in your knee. Repeat 3 times. Complete this stretch 2 times per day.   STRENGTHENING EXERCISES - Achilles Tendinitis These exercises may help you when beginning to rehabilitate your injury. They may resolve your symptoms with or without further involvement from your physician, physical therapist or athletic  trainer. While completing these exercises, remember:   Muscles can gain both the endurance and the strength needed for everyday activities through controlled exercises.  Complete these exercises as instructed by your physician, physical therapist or athletic trainer. Progress the resistance and repetitions only as guided.  You may experience muscle soreness or fatigue, but the pain or discomfort you are trying to eliminate should never worsen during these exercises. If this pain does worsen, stop and make certain you are following the directions exactly. If the pain is still present after adjustments, discontinue the exercise until you can discuss the trouble with your clinician.  STRENGTH - Plantar-flexors   Sit with your right / left leg extended. Holding onto both ends of a rubber exercise band/tubing, loop it around the ball of your foot. Keep a slight tension in the band.  Slowly push your toes away from you, pointing them downward.  Hold this position for 10 seconds. Return slowly, controlling the tension in the band/tubing. Repeat 3 times. Complete this exercise 2 times per day.   STRENGTH - Plantar-flexors   Stand with your feet shoulder width apart. Steady yourself with a wall or table using as little support as needed.  Keeping your weight evenly spread over the width of your feet, rise up on your toes.*  Hold this position for 10 seconds. Repeat 3 times. Complete this exercise 2 times per day.  *If this is too easy, shift your weight toward your right / left leg until you feel challenged. Ultimately, you may be asked to do this exercise with your right / left foot only.  STRENGTH  Plantar-flexors, Eccentric  Note: This exercise can place a lot of stress on your foot and ankle. Please complete this exercise only if specifically instructed by your caregiver.   Place the balls of your feet on a step. With your hands, use only enough support from a wall or rail to keep your  balance.  Keep your knees straight and rise up on your toes.  Slowly shift your weight entirely to your right / left toes and pick up your opposite foot. Gently and with controlled movement, lower your weight through your right / left foot so that your heel drops below the level of the step. You will feel a slight stretch in the back of your calf at the end position.  Use the healthy leg to help rise up onto the balls of both feet, then lower weight only on the right / left leg again. Build up to 15 repetitions. Then progress to 3 consecutive sets of 15 repetitions.*  After completing the above exercise, complete the same exercise with a slight knee bend (about 30 degrees). Again, build up to 15 repetitions. Then progress to 3 consecutive sets of 15 repetitions.* Perform this exercise 2 times per day.  *When you easily complete 3 sets of 15, your physician, physical therapist or athletic trainer may advise you to add resistance by wearing a backpack filled with additional weight.    STRENGTH - Plantar Flexors, Seated   Sit on a chair that allows your feet to rest flat on the ground. If necessary, sit at the edge of the chair.  Keeping your toes firmly on the ground, lift your right / left heel as far as you can without increasing any discomfort in your ankle. Repeat 3 times. Complete this exercise 2 times a day.  

## 2015-12-28 NOTE — Progress Notes (Signed)
Subjective:     Patient ID: [name], female   DOB: 05/06/1966, 50 y.o.   MRN: [id number]  HPI patient presents stating that she's had problems with both feet but is had a lot of pain in the left heel   Review of Systems  All other systems reviewed and are negative.      Objective:   Physical Exam  Constitutional: She is oriented to person, place, and time.  Cardiovascular: Intact distal pulses.   Musculoskeletal: Normal range of motion.  Neurological: She is oriented to person, place, and time.  Skin: Skin is warm.  Nursing note and vitals reviewed.  neurovascular status found to be intact with muscle strength adequate range of motion within normal limits with patient found to have inflammatory changes in the heel region bilateral left over right with fluid buildup around the medial band and pain when palpated. Patient's found to have good digital perfusion is well oriented 3 with moderate depression of the arch     Assessment:     Fasciitis-like symptoms with inflammation and fluid around the medial band left over right    Plan:     H&P x-rays reviewed and injected the plantar fascial left over right 3 mg Kenalog 5 mg Xylocaine and instructed on physical therapy and dispensed fascial brace with all instructions on usage  X-ray report indicates small spur formation with no indication stress fracture arthritis

## 2016-01-11 ENCOUNTER — Encounter: Payer: Self-pay | Admitting: Podiatry

## 2016-01-11 ENCOUNTER — Ambulatory Visit (INDEPENDENT_AMBULATORY_CARE_PROVIDER_SITE_OTHER): Payer: BC Managed Care – PPO | Admitting: Podiatry

## 2016-01-11 DIAGNOSIS — M722 Plantar fascial fibromatosis: Secondary | ICD-10-CM | POA: Diagnosis not present

## 2016-01-12 ENCOUNTER — Ambulatory Visit: Payer: BC Managed Care – PPO | Admitting: Podiatry

## 2016-01-12 NOTE — Progress Notes (Signed)
Subjective:     Patient ID: [name], female   DOB: 03/13/1966, 50 y.o.   MRN: [id number]  HPI patient presents stating she is doing better but she still has mild to moderate discomfort in the left heel   Review of Systems     Objective:   Physical Exam Neurovascular status intact muscle strength adequate with improvement of discomfort with pain still present upon deep palpation    Assessment:     Plantar fasciitis improved but still present    Plan:     Advised on the importance of physical therapy and not going barefoot along with stretching activities. Continue utilizing fascial brace ice therapy and will reappoint if symptoms persist
